# Patient Record
Sex: Male | Born: 1945 | Race: White | Hispanic: No | Marital: Married | Smoking: Former smoker
Health system: Southern US, Community
[De-identification: ages and names within clinical notes are randomized; demographics above are authoritative.]

## PROBLEM LIST (undated history)

## (undated) DIAGNOSIS — I341 Nonrheumatic mitral (valve) prolapse: Secondary | ICD-10-CM

## (undated) DIAGNOSIS — Z87442 Personal history of urinary calculi: Secondary | ICD-10-CM

## (undated) DIAGNOSIS — J449 Chronic obstructive pulmonary disease, unspecified: Secondary | ICD-10-CM

## (undated) DIAGNOSIS — M1990 Unspecified osteoarthritis, unspecified site: Secondary | ICD-10-CM

## (undated) DIAGNOSIS — C801 Malignant (primary) neoplasm, unspecified: Secondary | ICD-10-CM

## (undated) DIAGNOSIS — E785 Hyperlipidemia, unspecified: Secondary | ICD-10-CM

## (undated) DIAGNOSIS — N281 Cyst of kidney, acquired: Secondary | ICD-10-CM

## (undated) DIAGNOSIS — Z8719 Personal history of other diseases of the digestive system: Secondary | ICD-10-CM

## (undated) DIAGNOSIS — H9313 Tinnitus, bilateral: Secondary | ICD-10-CM

## (undated) DIAGNOSIS — J309 Allergic rhinitis, unspecified: Secondary | ICD-10-CM

## (undated) DIAGNOSIS — N529 Male erectile dysfunction, unspecified: Secondary | ICD-10-CM

## (undated) DIAGNOSIS — K219 Gastro-esophageal reflux disease without esophagitis: Secondary | ICD-10-CM

## (undated) DIAGNOSIS — M5030 Other cervical disc degeneration, unspecified cervical region: Secondary | ICD-10-CM

## (undated) HISTORY — PX: OTHER SURGICAL HISTORY: SHX169

## (undated) HISTORY — PX: PROSTATE SURGERY: SHX751

---

## 2012-06-27 ENCOUNTER — Emergency Department: Payer: Self-pay | Admitting: Emergency Medicine

## 2015-04-13 ENCOUNTER — Other Ambulatory Visit: Payer: Self-pay | Admitting: Family Medicine

## 2015-04-13 DIAGNOSIS — Z1231 Encounter for screening mammogram for malignant neoplasm of breast: Secondary | ICD-10-CM

## 2015-05-17 ENCOUNTER — Ambulatory Visit: Payer: Self-pay

## 2017-08-26 ENCOUNTER — Encounter: Payer: Self-pay | Admitting: *Deleted

## 2017-08-26 ENCOUNTER — Ambulatory Visit: Payer: Medicare Other | Admitting: Anesthesiology

## 2017-08-26 ENCOUNTER — Ambulatory Visit
Admission: RE | Admit: 2017-08-26 | Discharge: 2017-08-26 | Disposition: A | Discharge status: Home / Self Care | Payer: Medicare Other | Source: Ambulatory Visit | Attending: Gastroenterology | Admitting: Gastroenterology

## 2017-08-26 ENCOUNTER — Encounter
Admission: RE | Disposition: A | Discharge status: Home / Self Care | Payer: Self-pay | Source: Ambulatory Visit | Attending: Gastroenterology

## 2017-08-26 DIAGNOSIS — K2210 Ulcer of esophagus without bleeding: Secondary | ICD-10-CM | POA: Insufficient documentation

## 2017-08-26 DIAGNOSIS — Z8546 Personal history of malignant neoplasm of prostate: Secondary | ICD-10-CM | POA: Diagnosis not present

## 2017-08-26 DIAGNOSIS — K210 Gastro-esophageal reflux disease with esophagitis: Secondary | ICD-10-CM | POA: Diagnosis not present

## 2017-08-26 DIAGNOSIS — K2950 Unspecified chronic gastritis without bleeding: Secondary | ICD-10-CM | POA: Diagnosis not present

## 2017-08-26 DIAGNOSIS — Z79899 Other long term (current) drug therapy: Secondary | ICD-10-CM | POA: Diagnosis not present

## 2017-08-26 DIAGNOSIS — D124 Benign neoplasm of descending colon: Secondary | ICD-10-CM | POA: Diagnosis not present

## 2017-08-26 DIAGNOSIS — K219 Gastro-esophageal reflux disease without esophagitis: Secondary | ICD-10-CM | POA: Insufficient documentation

## 2017-08-26 DIAGNOSIS — K621 Rectal polyp: Secondary | ICD-10-CM | POA: Insufficient documentation

## 2017-08-26 DIAGNOSIS — Z7951 Long term (current) use of inhaled steroids: Secondary | ICD-10-CM | POA: Diagnosis not present

## 2017-08-26 DIAGNOSIS — Z7982 Long term (current) use of aspirin: Secondary | ICD-10-CM | POA: Diagnosis not present

## 2017-08-26 DIAGNOSIS — K635 Polyp of colon: Secondary | ICD-10-CM | POA: Diagnosis not present

## 2017-08-26 DIAGNOSIS — K449 Diaphragmatic hernia without obstruction or gangrene: Secondary | ICD-10-CM | POA: Diagnosis not present

## 2017-08-26 DIAGNOSIS — Z1211 Encounter for screening for malignant neoplasm of colon: Secondary | ICD-10-CM | POA: Diagnosis not present

## 2017-08-26 DIAGNOSIS — K648 Other hemorrhoids: Secondary | ICD-10-CM | POA: Diagnosis not present

## 2017-08-26 DIAGNOSIS — Z87891 Personal history of nicotine dependence: Secondary | ICD-10-CM | POA: Insufficient documentation

## 2017-08-26 DIAGNOSIS — K5730 Diverticulosis of large intestine without perforation or abscess without bleeding: Secondary | ICD-10-CM | POA: Insufficient documentation

## 2017-08-26 DIAGNOSIS — Z888 Allergy status to other drugs, medicaments and biological substances status: Secondary | ICD-10-CM | POA: Diagnosis not present

## 2017-08-26 HISTORY — PX: ESOPHAGOGASTRODUODENOSCOPY (EGD) WITH PROPOFOL: SHX5813

## 2017-08-26 HISTORY — DX: Gastro-esophageal reflux disease without esophagitis: K21.9

## 2017-08-26 HISTORY — DX: Male erectile dysfunction, unspecified: N52.9

## 2017-08-26 HISTORY — DX: Tinnitus, bilateral: H93.13

## 2017-08-26 HISTORY — DX: Personal history of urinary calculi: Z87.442

## 2017-08-26 HISTORY — DX: Personal history of other diseases of the digestive system: Z87.19

## 2017-08-26 HISTORY — DX: Cyst of kidney, acquired: N28.1

## 2017-08-26 HISTORY — PX: COLONOSCOPY WITH PROPOFOL: SHX5780

## 2017-08-26 HISTORY — DX: Malignant (primary) neoplasm, unspecified: C80.1

## 2017-08-26 HISTORY — DX: Allergic rhinitis, unspecified: J30.9

## 2017-08-26 SURGERY — ESOPHAGOGASTRODUODENOSCOPY (EGD) WITH PROPOFOL
Anesthesia: General

## 2017-08-26 MED ORDER — MIDAZOLAM HCL 2 MG/2ML IJ SOLN
INTRAMUSCULAR | Status: DC | PRN
  Administered 2017-08-26: 1 mg via INTRAVENOUS

## 2017-08-26 MED ORDER — PHENYLEPHRINE HCL 10 MG/ML IJ SOLN
INTRAMUSCULAR | Status: DC | PRN
  Administered 2017-08-26: 150 ug via INTRAVENOUS
  Administered 2017-08-26 (×2): 100 ug via INTRAVENOUS

## 2017-08-26 MED ORDER — FENTANYL CITRATE (PF) 100 MCG/2ML IJ SOLN
INTRAMUSCULAR | Status: DC | PRN
  Administered 2017-08-26: 50 ug via INTRAVENOUS

## 2017-08-26 MED ORDER — EPHEDRINE SULFATE 50 MG/ML IJ SOLN
INTRAMUSCULAR | Status: AC
  Filled 2017-08-26: qty 1

## 2017-08-26 MED ORDER — SODIUM CHLORIDE 0.9 % IV SOLN: INTRAVENOUS | Status: DC

## 2017-08-26 MED ORDER — LIDOCAINE HCL (CARDIAC) 20 MG/ML IV SOLN
INTRAVENOUS | Status: DC | PRN
  Administered 2017-08-26: 100 mg via INTRAVENOUS

## 2017-08-26 MED ORDER — PROPOFOL 500 MG/50ML IV EMUL
INTRAVENOUS | Status: AC
  Filled 2017-08-26: qty 50

## 2017-08-26 MED ORDER — PROPOFOL 10 MG/ML IV BOLUS
INTRAVENOUS | Status: DC | PRN
  Administered 2017-08-26: 40 mg via INTRAVENOUS

## 2017-08-26 MED ORDER — EPHEDRINE SULFATE 50 MG/ML IJ SOLN
INTRAMUSCULAR | Status: DC | PRN
  Administered 2017-08-26: 10 mg via INTRAVENOUS

## 2017-08-26 MED ORDER — MIDAZOLAM HCL 2 MG/2ML IJ SOLN
INTRAMUSCULAR | Status: AC
  Filled 2017-08-26: qty 2

## 2017-08-26 MED ORDER — LIDOCAINE HCL (PF) 2 % IJ SOLN
INTRAMUSCULAR | Status: AC
  Filled 2017-08-26: qty 10

## 2017-08-26 MED ORDER — SODIUM CHLORIDE 0.9 % IV SOLN
INTRAVENOUS | Status: DC
  Administered 2017-08-26 (×2): via INTRAVENOUS

## 2017-08-26 MED ORDER — PROPOFOL 500 MG/50ML IV EMUL
INTRAVENOUS | Status: DC | PRN
  Administered 2017-08-26: 180 ug/kg/min via INTRAVENOUS

## 2017-08-26 MED ORDER — FENTANYL CITRATE (PF) 100 MCG/2ML IJ SOLN
INTRAMUSCULAR | Status: AC
  Filled 2017-08-26: qty 2

## 2017-08-26 NOTE — Anesthesia Postprocedure Evaluation (Signed)
Anesthesia Post Note  Patient: Tristan Anderson  Procedure(s) Performed: ESOPHAGOGASTRODUODENOSCOPY (EGD) WITH PROPOFOL (N/A ) COLONOSCOPY WITH PROPOFOL (N/A )  Patient location during evaluation: Endoscopy Anesthesia Type: General Level of consciousness: awake and alert Pain management: pain level controlled Vital Signs Assessment: post-procedure vital signs reviewed and stable Respiratory status: spontaneous breathing and respiratory function stable Cardiovascular status: stable Anesthetic complications: no     Last Vitals:  Vitals:   08/26/17 1033 08/26/17 1103  BP: 127/79 131/87  Pulse: 89   Resp:    Temp: (!) 35.7 C   SpO2: 98%     Last Pain:  Vitals:   08/26/17 1033  TempSrc: Tympanic                 Blia Totman K

## 2017-08-26 NOTE — Op Note (Signed)
[facility] Gastroenterology Patient Name: Tristan Anderson Procedure Date: 08/26/2017 9:13 AM MRN: [id number] Account #: [id number] Date of Birth: 12-Dec-1945 Admit Type: Outpatient Age: 72 Room: [facility] ENDO ROOM 1 Gender: Male Note Status: Finalized Procedure:            Colonoscopy Indications:          Screening for colorectal malignant neoplasm Providers:            Lollie Sails, MD Referring MD:         Wynona Canes. Kym Groom, MD (Referring MD) Medicines:            Monitored Anesthesia Care Complications:        No immediate complications. Procedure:            Pre-Anesthesia Assessment:                       - ASA Grade Assessment: II - A patient with mild                        systemic disease.                       After obtaining informed consent, the colonoscope was                        passed under direct vision. Throughout the procedure,                        the patient's blood pressure, pulse, and oxygen                        saturations were monitored continuously. The                        Colonoscope was introduced through the anus and                        advanced to the the cecum, identified by appendiceal                        orifice and ileocecal valve. The colonoscopy was                        performed with moderate difficulty due to poor bowel                        prep, significant looping and a tortuous colon.                        Successful completion of the procedure was aided by                        changing the patient to a supine position, changing the                        patient to a prone position, using manual pressure and                        lavage. The quality of the bowel preparation was fair. Findings:      A 5 mm  polyp was found in the descending colon. The polyp was sessile.       The polyp was removed with a cold snare. Resection and retrieval were       complete.      A 2 mm polyp was found in the  proximal transverse colon. The polyp was       sessile. The polyp was removed with a cold biopsy forceps. Resection and       retrieval were complete.      Two sessile polyps were found in the rectum. The polyps were 1 to 2 mm       in size. These polyps were removed with a cold biopsy forceps. Resection       and retrieval were complete.      A few small-mouthed diverticula were found in the sigmoid colon.      Non-bleeding internal hemorrhoids were found during retroflexion and       during anoscopy. The hemorrhoids were Grade I (internal hemorrhoids that       do not prolapse).      The digital rectal exam was normal. Impression:           - Preparation of the colon was fair.                       - One 5 mm polyp in the descending colon, removed with                        a cold snare. Resected and retrieved.                       - One 2 mm polyp in the proximal transverse colon,                        removed with a cold biopsy forceps. Resected and                        retrieved.                       - Two 1 to 2 mm polyps in the rectum, removed with a                        cold biopsy forceps. Resected and retrieved.                       - Diverticulosis in the sigmoid colon.                       - Non-bleeding internal hemorrhoids. Recommendation:       - Discharge patient to home.                       - Telephone GI clinic for pathology results in 1 week. Procedure Code(s):    --- Professional ---                       [phone], Colonoscopy, flexible; with removal of tumor(s),                        polyp(s), or other lesion(s) by snare technique  [id number], 59, Colonoscopy, flexible; with biopsy, single                        or multiple CPT copyright 2016 American Medical Association. All rights reserved. The codes documented in this report are preliminary and upon coder review may  be revised to meet current compliance requirements. Lollie Sails,  MD 08/26/2017 10:23:10 AM This report has been signed electronically. Number of Addenda: 0 Note Initiated On: 08/26/2017 9:13 AM Scope Withdrawal Time: 0 hours 11 minutes 34 seconds  Total Procedure Duration: 0 hours 42 minutes 57 seconds       [facility]

## 2017-08-26 NOTE — Anesthesia Post-op Follow-up Note (Signed)
Anesthesia QCDR form completed.        

## 2017-08-26 NOTE — Anesthesia Procedure Notes (Signed)
Date/Time: 08/26/2017 9:45 AM Performed by: Allean Found, CRNA Pre-anesthesia Checklist: Patient identified, Emergency Drugs available, Suction available, Patient being monitored and Timeout performed Patient Re-evaluated:Patient Re-evaluated prior to induction Oxygen Delivery Method: Nasal cannula Placement Confirmation: positive ETCO2

## 2017-08-26 NOTE — Anesthesia Preprocedure Evaluation (Signed)
Anesthesia Evaluation  Patient identified by MRN, date of birth, ID band Patient awake    Reviewed: Allergy & Precautions, NPO status , Patient's Chart, lab work & pertinent test results  History of Anesthesia Complications Negative for: history of anesthetic complications  Airway Mallampati: I       Dental   Pulmonary neg sleep apnea, neg COPD, former smoker,           Cardiovascular (-) hypertension(-) Past MI and (-) CHF (-) dysrhythmias + Valvular Problems/Murmurs MVP      Neuro/Psych neg Seizures    GI/Hepatic Neg liver ROS, hiatal hernia, GERD  Medicated,  Endo/Other  neg diabetes  Renal/GU negative Renal ROS     Musculoskeletal   Abdominal   Peds  Hematology   Anesthesia Other Findings   Reproductive/Obstetrics                             Anesthesia Physical Anesthesia Plan  ASA: II  Anesthesia Plan: General   Post-op Pain Management:    Induction: Intravenous  PONV Risk Score and Plan: 2 and Propofol infusion and TIVA  Airway Management Planned: Nasal Cannula  Additional Equipment:   Intra-op Plan:   Post-operative Plan:   Informed Consent: I have reviewed the patients History and Physical, chart, labs and discussed the procedure including the risks, benefits and alternatives for the proposed anesthesia with the patient or authorized representative who has indicated his/her understanding and acceptance.     Plan Discussed with:   Anesthesia Plan Comments:         Anesthesia Quick Evaluation

## 2017-08-26 NOTE — Transfer of Care (Signed)
Immediate Anesthesia Transfer of Care Note  Patient: Tristan Anderson  Procedure(s) Performed: ESOPHAGOGASTRODUODENOSCOPY (EGD) WITH PROPOFOL (N/A ) COLONOSCOPY WITH PROPOFOL (N/A )  Patient Location: PACU  Anesthesia Type:General  Level of Consciousness: awake  Airway & Oxygen Therapy: Patient Spontanous Breathing and Patient connected to nasal cannula oxygen  Post-op Assessment: Report given to RN and Post -op Vital signs reviewed and stable  Post vital signs: Reviewed and stable  Last Vitals:  Vitals:   08/26/17 0855 08/26/17 1033  BP: 139/83   Pulse: 75   Resp: 18   Temp: (!) 36.3 C (!) (P) 35.7 C  SpO2: 97%     Last Pain:  Vitals:   08/26/17 1033  TempSrc: (P) Tympanic         Complications: No apparent anesthesia complications

## 2017-08-26 NOTE — Op Note (Signed)
[facility] Gastroenterology Patient Name: Tristan Anderson Procedure Date: 08/26/2017 9:13 AM MRN: [id number] Account #: [id number] Date of Birth: [birth year] Admit Type: Outpatient Age: 72 Room: [facility] ENDO ROOM 1 Gender: Male Note Status: Finalized Procedure:            Upper GI endoscopy Indications:          Gastro-esophageal reflux disease Providers:            Lollie Sails, MD Referring MD:         Wynona Canes. Kym Groom, MD (Referring MD) Medicines:            Monitored Anesthesia Care Complications:        No immediate complications. Procedure:            Pre-Anesthesia Assessment:                       - ASA Grade Assessment: II - A patient with mild                        systemic disease.                       After obtaining informed consent, the endoscope was                        passed under direct vision. Throughout the procedure,                        the patient's blood pressure, pulse, and oxygen                        saturations were monitored continuously. The Endoscope                        was introduced through the mouth, and advanced to the                        third part of duodenum. The patient tolerated the                        procedure well. Findings:      LA Grade B (one or more mucosal breaks greater than 5 mm, not extending       between the tops of two mucosal folds) esophagitis with no bleeding was       found 49 cm from the incisors.      There were esophageal mucosal changes suggestive of short-segment       Barrett's esophagus present in the lower third of the esophagus. The       maximum longitudinal extent of these mucosal changes was 1 cm in length.       Mucosa was biopsied with a cold forceps for histology.      The exam of the esophagus was otherwise normal.      A small hiatal hernia was found. The Z-line was a variable distance from       incisors; the hiatal hernia was sliding.      Patchy mild inflammation  characterized by congestion (edema), erosions       and erythema was found in the gastric body, on the greater curvature of       the gastric body and  in the gastric antrum. Biopsies were taken with a       cold forceps for histology. Biopsies were taken with a cold forceps for       Helicobacter pylori testing.      The cardia and gastric fundus were normal on retroflexion otherwise.      The examined duodenum was normal. Impression:           - LA Grade B erosive esophagitis.                       - Esophageal mucosal changes suggestive of                        short-segment Barrett's esophagus. Biopsied.                       - Small hiatal hernia.                       - Erosive gastritis. Biopsied.                       - Normal examined duodenum. Recommendation:       - Await pathology results.                       - Use Protonix (pantoprazole) 40 mg PO daily daily. Procedure Code(s):    --- Professional ---                       [phone], Esophagogastroduodenoscopy, flexible, transoral;                        with biopsy, single or multiple Diagnosis Code(s):    --- Professional ---                       K20.8, Other esophagitis                       K22.8, Other specified diseases of esophagus                       K44.9, Diaphragmatic hernia without obstruction or                        gangrene                       K29.60, Other gastritis without bleeding                       K21.9, Gastro-esophageal reflux disease without                        esophagitis CPT copyright 2016 American Medical Association. All rights reserved. The codes documented in this report are preliminary and upon coder review may  be revised to meet current compliance requirements. Lollie Sails, MD 08/26/2017 9:33:57 AM This report has been signed electronically. Number of Addenda: 0 Note Initiated On: 08/26/2017 9:13 AM      [facility]

## 2017-08-26 NOTE — H&P (Signed)
Outpatient short stay form Pre-procedure 08/26/2017 8:55 AM Lollie Sails MD  Primary Physician: Dr. Johny Drilling  Reason for visit:  EGD and colonoscopy  History of present illness:  Patient is a 72 year old male presenting today as above. He has personal history of reflux related esophagitis seen on EGD in 2008. He has been taking NSAIDs regularly. His last colonoscopy was also in 2008.  Patient tolerated his prep well. He does take 81 mg aspirin that he is held for a couple days. He takes no other aspirin products or blood thinning agents.    Current Facility-Administered Medications:  .  0.9 %  sodium chloride infusion, , Intravenous, Continuous, Lollie Sails, MD .  0.9 %  sodium chloride infusion, , Intravenous, Continuous, Lollie Sails, MD  Medications Prior to Admission  Medication Sig Dispense Refill Last Dose  . albuterol (PROVENTIL HFA;VENTOLIN HFA) 108 (90 Base) MCG/ACT inhaler Inhale into the lungs every 6 (six) hours as needed for wheezing or shortness of breath.   Past Month at Unknown time  . aspirin EC 81 MG tablet Take 81 mg by mouth daily.   Past Week at Unknown time  . clotrimazole-betamethasone (LOTRISONE) cream Apply 1 application topically 2 (two) times daily.   08/26/2017 at 0730  . fluticasone (FLONASE) 50 MCG/ACT nasal spray Place into both nostrils 2 (two) times daily as needed for allergies or rhinitis.   Past Week at Unknown time  . Multiple Vitamin (MULTIVITAMIN) tablet Take 1 tablet by mouth daily.   Past Week at Unknown time  . omeprazole (PRILOSEC) 20 MG capsule Take 20 mg by mouth daily.   08/25/2017 at Unknown time  . pravastatin (PRAVACHOL) 40 MG tablet Take 40 mg by mouth daily.   Past Week at Unknown time  . fexofenadine (ALLEGRA) 30 MG tablet Take 30 mg by mouth 2 (two) times daily as needed.   Not Taking at Unknown time  . sildenafil (REVATIO) 20 MG tablet Take 20 mg by mouth 3 (three) times daily.   Not Taking at Unknown time      Allergies  Allergen Reactions  . Darvon [Propoxyphene]      Past Medical History:  Diagnosis Date  . Allergic rhinitis   . Cancer [facility])    prostate  . Cyst of right kidney   . ED (erectile dysfunction)   . GERD (gastroesophageal reflux disease)   . History of hiatal hernia   . History of kidney stones   . Tinnitus of both ears     Review of systems:      Physical Exam    Heart and lungs: Regular rate and rhythm without rub or gallop, lungs are bilaterally clear.    HEENT: Normocephalic atraumatic eyes are anicteric    Other:     Pertinant exam for procedure: Soft nontender nondistended bowel sounds positive normoactive.    Planned proceedures: EGD and Colonoscopy with indicated procedures. I have discussed the risks benefits and complications of procedures to include not limited to bleeding, infection, perforation and the risk of sedation and the patient wishes to proceed.    Lollie Sails, MD Gastroenterology 08/26/2017  8:55 AM

## 2017-08-27 ENCOUNTER — Encounter: Payer: Self-pay | Admitting: Gastroenterology

## 2017-08-29 LAB — SURGICAL PATHOLOGY

## 2020-04-11 ENCOUNTER — Other Ambulatory Visit: Payer: Self-pay | Admitting: Specialist

## 2020-04-11 DIAGNOSIS — R0609 Other forms of dyspnea: Secondary | ICD-10-CM

## 2020-04-11 DIAGNOSIS — R059 Cough, unspecified: Secondary | ICD-10-CM

## 2020-04-21 ENCOUNTER — Ambulatory Visit
Admission: RE | Admit: 2020-04-21 | Discharge: 2020-04-21 | Disposition: A | Discharge status: Home / Self Care | Payer: Medicare Other | Source: Ambulatory Visit | Attending: Specialist | Admitting: Specialist

## 2020-04-21 ENCOUNTER — Other Ambulatory Visit: Payer: Self-pay

## 2020-04-21 DIAGNOSIS — R059 Cough, unspecified: Secondary | ICD-10-CM

## 2020-04-21 DIAGNOSIS — R05 Cough: Secondary | ICD-10-CM | POA: Insufficient documentation

## 2020-04-21 DIAGNOSIS — R0600 Dyspnea, unspecified: Secondary | ICD-10-CM | POA: Diagnosis present

## 2020-04-21 DIAGNOSIS — R0609 Other forms of dyspnea: Secondary | ICD-10-CM

## 2021-04-21 ENCOUNTER — Other Ambulatory Visit: Payer: Self-pay

## 2021-04-21 ENCOUNTER — Encounter: Payer: Self-pay | Admitting: Emergency Medicine

## 2021-04-21 ENCOUNTER — Ambulatory Visit
Admission: EM | Admit: 2021-04-21 | Discharge: 2021-04-21 | Disposition: A | Discharge status: Home / Self Care | Payer: Medicare Other | Attending: Emergency Medicine | Admitting: Emergency Medicine

## 2021-04-21 DIAGNOSIS — J069 Acute upper respiratory infection, unspecified: Secondary | ICD-10-CM | POA: Insufficient documentation

## 2021-04-21 DIAGNOSIS — U071 COVID-19: Secondary | ICD-10-CM | POA: Diagnosis not present

## 2021-04-21 MED ORDER — IPRATROPIUM BROMIDE 0.06 % NA SOLN: 2.0000 | Freq: Four times a day (QID) | NASAL | 12 refills | Status: AC

## 2021-04-21 MED ORDER — BENZONATATE 100 MG PO CAPS: 200.0000 mg | ORAL_CAPSULE | Freq: Three times a day (TID) | ORAL | 0 refills | Status: AC

## 2021-04-21 NOTE — Discharge Instructions (Addendum)
Use the Atrovent nasal spray, 2 squirts in each nostril every 6 hours, as needed for runny nose and postnasal drip.  Use the Tessalon Perles every 8 hours during the day.  Take them with a small sip of water.  They may give you some numbness to the base of your tongue or a metallic taste in your mouth, this is normal.  If your COVID test is positive we will prescribe Paxlovid twice daily for 5 days.   Return for reevaluation or see your primary care provider for any new or worsening symptoms.

## 2021-04-21 NOTE — ED Provider Notes (Addendum)
[facility]    CSN: [id number] Arrival date & time: 04/21/21  1141      History   Chief Complaint Chief Complaint  Patient presents with   Cough   Fatigue   Generalized Body Aches    HPI Tristan Anderson is a 75 y.o. male.   HPI  75 year old male here for evaluation of respiratory complaints.  Patient reports that he has been experiencing nasal congestion with a runny nose for clear nasal discharge, cough that is both productive and nonproductive, body aches, fatigue, and fever that started yesterday.  Patient reports a T-max of 100.  He is currently on prednisone and Trelegy for interstitial lung disease.  He reports that his cough is only intermittently productive and it usually first thing in the morning.  He states he coughs also mostly in the morning but does not have an issue at nighttime.  He has not had associated symptoms of ear pain or pressure, sore throat, shortness of breath or wheezing, GI complaints, or known sick contacts.  Patient not in any acute distress, he can speak in full sentences, he has no dyspnea or tachypnea.  Past Medical History:  Diagnosis Date   Allergic rhinitis    Cancer ([place])    prostate   Cyst of right kidney    ED (erectile dysfunction)    GERD (gastroesophageal reflux disease)    History of hiatal hernia    History of kidney stones    Tinnitus of both ears     There are no problems to display for this patient.   Past Surgical History:  Procedure Laterality Date   COLONOSCOPY WITH PROPOFOL N/A 08/26/2017   Procedure: COLONOSCOPY WITH PROPOFOL;  Surgeon: Lollie Sails, MD;  Location: [facility] ENDOSCOPY;  Service: Endoscopy;  Laterality: N/A;   ESOPHAGOGASTRODUODENOSCOPY (EGD) WITH PROPOFOL N/A 08/26/2017   Procedure: ESOPHAGOGASTRODUODENOSCOPY (EGD) WITH PROPOFOL;  Surgeon: Lollie Sails, MD;  Location: [facility] ENDOSCOPY;  Service: Endoscopy;  Laterality: N/A;   muscle transfer uppper extremity     PROSTATE SURGERY          Home Medications    Prior to Admission medications   Medication Sig Start Date End Date Taking? Authorizing Provider  aspirin EC 81 MG tablet Take 81 mg by mouth daily.   Yes [provider]  benzonatate (TESSALON) 100 MG capsule Take 2 capsules (200 mg total) by mouth every 8 (eight) hours. 04/21/21  Yes Margarette Canada, NP  fexofenadine (ALLEGRA) 30 MG tablet Take 30 mg by mouth 2 (two) times daily as needed.   Yes [provider]  fluticasone (FLONASE) 50 MCG/ACT nasal spray Place into both nostrils 2 (two) times daily as needed for allergies or rhinitis.   Yes [provider]  gabapentin (NEURONTIN) 100 MG capsule Take by mouth. 04/04/21  Yes [provider]  ipratropium (ATROVENT) 0.06 % nasal spray Place 2 sprays into both nostrils 4 (four) times daily. 04/21/21  Yes Margarette Canada, NP  Multiple Vitamin (MULTIVITAMIN) tablet Take 1 tablet by mouth daily.   Yes [provider]  pantoprazole (PROTONIX) 40 MG tablet Take 40 mg by mouth daily. 03/27/21  Yes [provider]  pravastatin (PRAVACHOL) 40 MG tablet Take 40 mg by mouth daily.   Yes [provider]  predniSONE (DELTASONE) 10 MG tablet Take by mouth. 01/24/21  Yes [provider]  TRELEGY ELLIPTA 100-62.5-25 MCG/INH AEPB Inhale 1 puff into the lungs daily. 04/04/21  Yes [provider]  albuterol (  PROVENTIL HFA;VENTOLIN HFA) 108 (90 Base) MCG/ACT inhaler Inhale into the lungs every 6 (six) hours as needed for wheezing or shortness of breath.    [provider]  clotrimazole-betamethasone (LOTRISONE) cream Apply 1 application topically 2 (two) times daily.    [provider]  sildenafil (REVATIO) 20 MG tablet Take 20 mg by mouth 3 (three) times daily.    [provider]    Family History History reviewed. No pertinent family history.  Social History Social History   Tobacco Use   Smoking status: Former   Smokeless tobacco:  Never  Scientific laboratory technician Use: Never used  Substance Use Topics   Alcohol use: Yes    Alcohol/week: 7.0 - 14.0 standard drinks    Types: 1 - 2 Glasses of wine, 6 - 12 Cans of beer per week     Allergies   Darvon [propoxyphene]   Review of Systems Review of Systems  Constitutional:  Positive for fatigue and fever. Negative for activity change and appetite change.  HENT:  Positive for congestion and rhinorrhea. Negative for ear pain and sore throat.   Respiratory:  Positive for cough. Negative for shortness of breath and wheezing.   Gastrointestinal:  Negative for diarrhea, nausea and vomiting.  Skin:  Negative for rash.  Hematological: Negative.   Psychiatric/Behavioral: Negative.      Physical Exam Triage Vital Signs ED Triage Vitals  Enc Vitals Group     BP 04/21/21 1235 (!) 145/83     Pulse Rate 04/21/21 1235 79     Resp 04/21/21 1235 16     Temp 04/21/21 1235 98.5 F (36.9 C)     Temp Source 04/21/21 1235 Oral     SpO2 04/21/21 1235 95 %     Weight 04/21/21 1230 180 lb (81.6 kg)     Height 04/21/21 1230 5' 7.5" (1.715 m)     Head Circumference --      Peak Flow --      Pain Score 04/21/21 1229 2     Pain Loc --      Pain Edu? --      Excl. in [place]? --    No data found.  Updated Vital Signs BP (!) 145/83 (BP Location: Left Arm)   Pulse 79   Temp 98.5 F (36.9 C) (Oral)   Resp 16   Ht 5' 7.5" (1.715 m)   Wt 180 lb (81.6 kg)   SpO2 95%   BMI 27.78 kg/m   Visual Acuity Right Eye Distance:   Left Eye Distance:   Bilateral Distance:    Right Eye Near:   Left Eye Near:    Bilateral Near:     Physical Exam Vitals and nursing note reviewed.  Constitutional:      General: He is not in acute distress.    Appearance: Normal appearance. He is normal weight. He is not ill-appearing.  HENT:     Head: Normocephalic and atraumatic.     Right Ear: Tympanic membrane, ear canal and external ear normal. There is no impacted cerumen.     Left Ear: Tympanic  membrane, ear canal and external ear normal. There is no impacted cerumen.     Nose: Congestion and rhinorrhea present.     Mouth/Throat:     Mouth: Mucous membranes are moist.     Pharynx: Oropharynx is clear. Posterior oropharyngeal erythema present.  Cardiovascular:     Rate and Rhythm: Normal rate and regular rhythm.  Pulses: Normal pulses.     Heart sounds: Normal heart sounds. No murmur heard.   No gallop.  Pulmonary:     Effort: Pulmonary effort is normal.     Breath sounds: Normal breath sounds. No wheezing, rhonchi or rales.  Musculoskeletal:     Cervical back: Normal range of motion and neck supple.  Lymphadenopathy:     Cervical: No cervical adenopathy.  Skin:    General: Skin is warm and dry.     Capillary Refill: Capillary refill takes less than 2 seconds.     Findings: No erythema or rash.  Neurological:     General: No focal deficit present.     Mental Status: He is alert and oriented to person, place, and time.  Psychiatric:        Mood and Affect: Mood normal.        Behavior: Behavior normal.        Thought Content: Thought content normal.        Judgment: Judgment normal.     UC Treatments / Results  Labs (all labs ordered are listed, but only abnormal results are displayed) Labs Reviewed  SARS CORONAVIRUS 2 (TAT 6-24 HRS)    EKG   Radiology No results found.  Procedures Procedures (including critical care time)  Medications Ordered in UC Medications - No data to display  Initial Impression / Assessment and Plan / UC Course  I have reviewed the triage vital signs and the nursing notes.  Pertinent labs & imaging results that were available during my care of the patient were reviewed by me and considered in my medical decision making (see chart for details).  Patient is a very pleasant nontoxic-appearing 75 year old male here for evaluation of respiratory complaints as outlined in the HPI above.  Patient's physical exam reveals pearly gray  tympanic membranes bilaterally with normal light reflex and clear external auditory canals.  Nasal mucosa is erythematous and mildly edematous with scant clear nasal discharge.  Oropharyngeal exam is benign.  No cervical lymphadenopathy appreciated on exam.  Cardiopulmonary exam reveals clear lung sounds in all fields.  Patient exam is consistent with a viral URI.  Due to patient's immunosuppression secondary to his interstitial lung disease will swab patient for COVID.  He has been vaccinated and boosted.  We will also give patient Atrovent nasal spray to help with nasal congestion and Tessalon Perles for cough.  Return and ER precautions reviewed with patient.  If patient is positive for COVID we will prescribe Paxlovid.  Patient's GFR from January 13, 2021 is 58.   Final Clinical Impressions(s) / UC Diagnoses   Final diagnoses:  Viral URI with cough     Discharge Instructions      Use the Atrovent nasal spray, 2 squirts in each nostril every 6 hours, as needed for runny nose and postnasal drip.  Use the Tessalon Perles every 8 hours during the day.  Take them with a small sip of water.  They may give you some numbness to the base of your tongue or a metallic taste in your mouth, this is normal.  If your COVID test is positive we will prescribe Paxlovid twice daily for 5 days.   Return for reevaluation or see your primary care provider for any new or worsening symptoms.      ED Prescriptions     Medication Sig Dispense Auth. Provider   benzonatate (TESSALON) 100 MG capsule Take 2 capsules (200 mg total) by mouth every 8 (eight) hours.  21 capsule Margarette Canada, NP   ipratropium (ATROVENT) 0.06 % nasal spray Place 2 sprays into both nostrils 4 (four) times daily. 15 mL Margarette Canada, NP      PDMP not reviewed this encounter.   Margarette Canada, NP 04/21/21 1334    Margarette Canada, NP 04/21/21 1335

## 2021-04-21 NOTE — ED Triage Notes (Signed)
Patient c/o cough, congestion, runny nose, bodyaches and fatigue that started yesterday.  Patient also reports low grade fever.

## 2021-04-22 ENCOUNTER — Telehealth: Payer: Self-pay | Admitting: Emergency Medicine

## 2021-04-22 LAB — SARS CORONAVIRUS 2 (TAT 6-24 HRS): SARS Coronavirus 2: POSITIVE — AB

## 2021-04-22 MED ORDER — NIRMATRELVIR/RITONAVIR (PAXLOVID)TABLET: 3.0000 | ORAL_TABLET | Freq: Two times a day (BID) | ORAL | 0 refills | Status: AC

## 2021-04-22 NOTE — Telephone Encounter (Signed)
I spoke with the patient via telephone this morning and verified his identity by name and date of birth.  Patient notified that his COVID test was positive and I informed him that I sent a prescription for Paxlovid to the pharmacy and he is to take it twice daily for 5 days.  The sildenafil that he takes will interact with the Paxlovid so advised him to hold it while he was on the Paxlovid.  He is also concerned about the thick mucus in his throat and I advised him that he can take plain over-the-counter Mucinex and increase his water intake to help thin his mucus out and make it easier to clear and manage.  I reviewed the ER precautions to include any development of shortness of breath, especially at rest, and inability to speak in full sentences, or late side of lips turning blue that he is to go to the ER for evaluation.  Patient verbalized understanding of instructions.

## 2021-05-04 ENCOUNTER — Other Ambulatory Visit: Payer: Self-pay | Admitting: Specialist

## 2021-05-04 ENCOUNTER — Other Ambulatory Visit (HOSPITAL_BASED_OUTPATIENT_CLINIC_OR_DEPARTMENT_OTHER): Payer: Self-pay | Admitting: Specialist

## 2021-05-04 DIAGNOSIS — J849 Interstitial pulmonary disease, unspecified: Secondary | ICD-10-CM

## 2021-05-04 DIAGNOSIS — J439 Emphysema, unspecified: Secondary | ICD-10-CM

## 2021-05-24 ENCOUNTER — Ambulatory Visit
Admission: RE | Admit: 2021-05-24 | Discharge: 2021-05-24 | Disposition: A | Discharge status: Home / Self Care | Payer: Medicare Other | Source: Ambulatory Visit | Attending: Specialist | Admitting: Specialist

## 2021-05-24 DIAGNOSIS — J439 Emphysema, unspecified: Secondary | ICD-10-CM | POA: Insufficient documentation

## 2021-05-24 DIAGNOSIS — J849 Interstitial pulmonary disease, unspecified: Secondary | ICD-10-CM | POA: Diagnosis not present

## 2021-08-17 IMAGING — CT CT CHEST W/O CM
1 series · 14 of 34 positions shown, 18 images · non-contrast
Comparison: None.

CLINICAL DATA: Dry cough for 2 months, shortness of breath, former
smoker

EXAM:
CT CHEST WITHOUT CONTRAST
TECHNIQUE: Multidetector CT imaging of the chest was performed following the
standard protocol without IV contrast.

[Series 2: thorax · axial · 0.78mm/px · z∈[-575,-313]mm · 14 of 155 slices shown, 18 images]
[im 12/155  mediastinal]
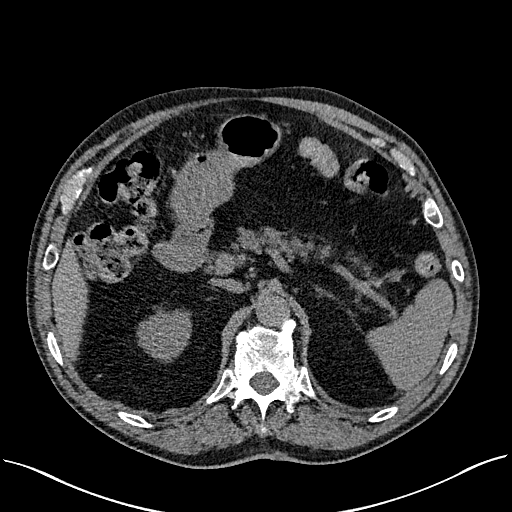
[im 12/155  lung]
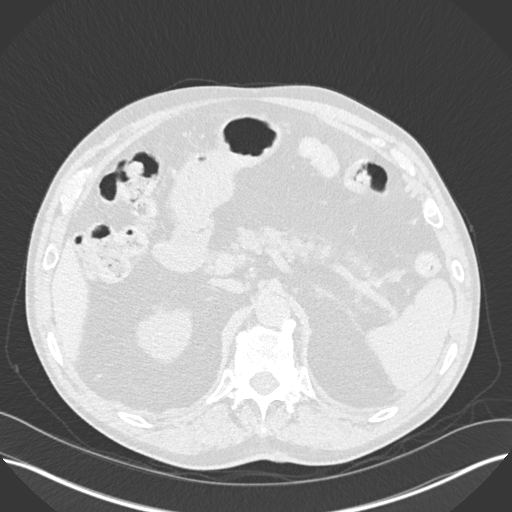
[im 23/155  lung]
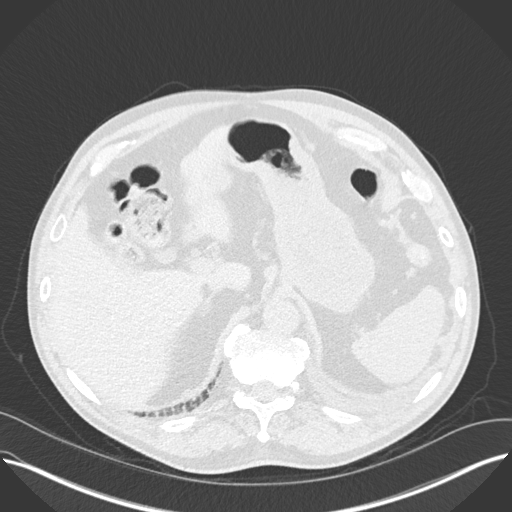
[im 31/155  lung]
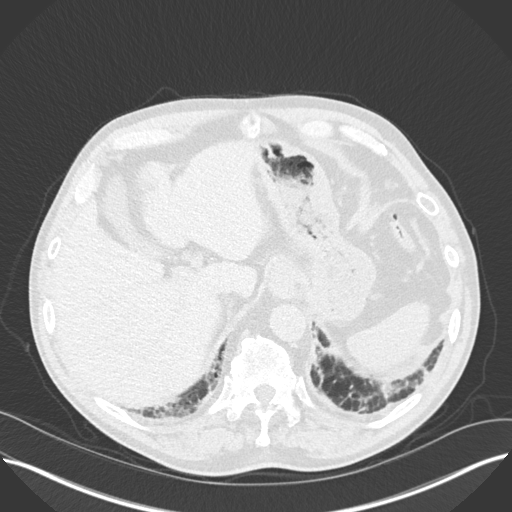
[im 46/155  lung]
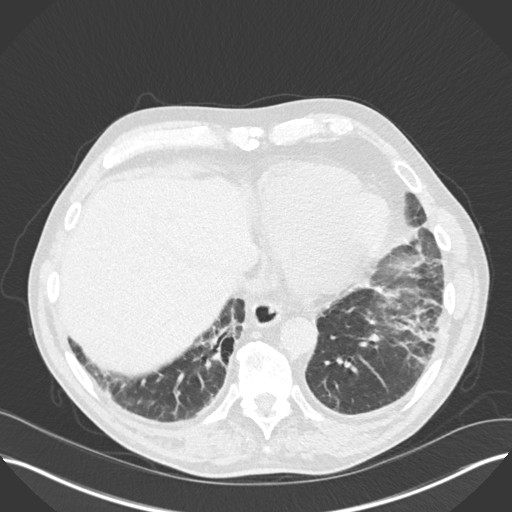
[im 58/155  mediastinal]
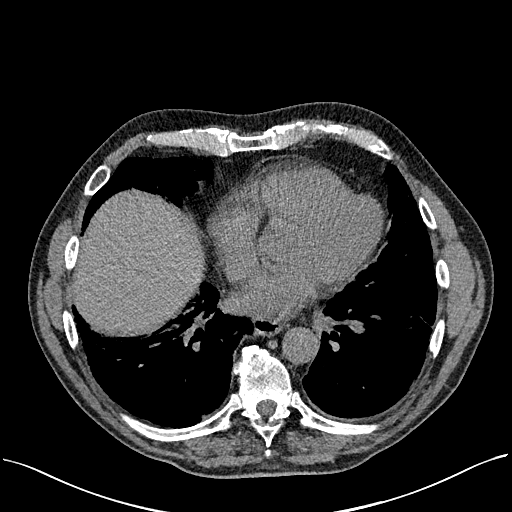
[im 58/155  lung]
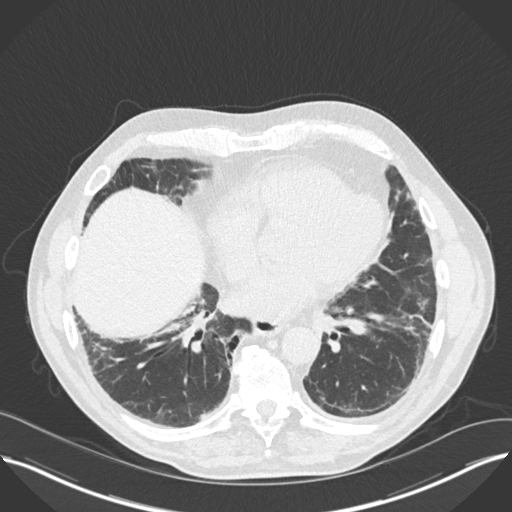
[im 63/155  lung]
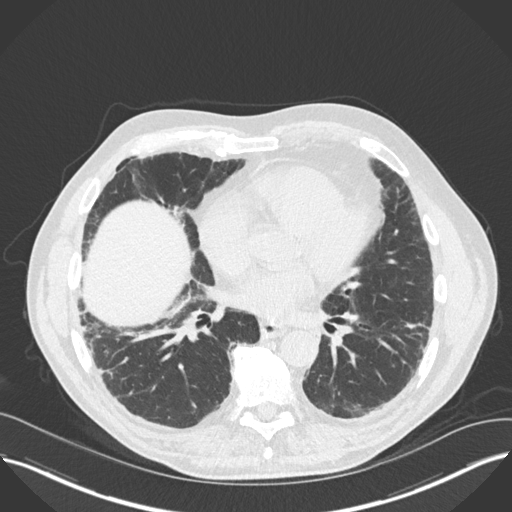
[im 74/155  lung]
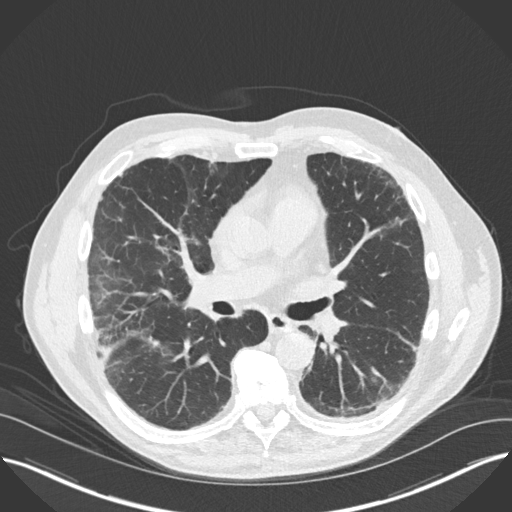
[im 83/155  lung]
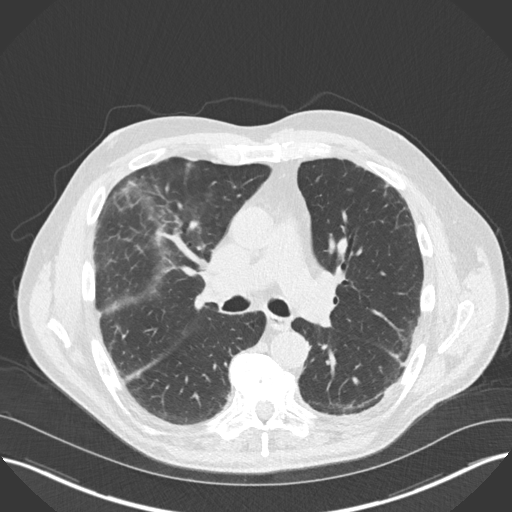
[im 92/155  mediastinal]
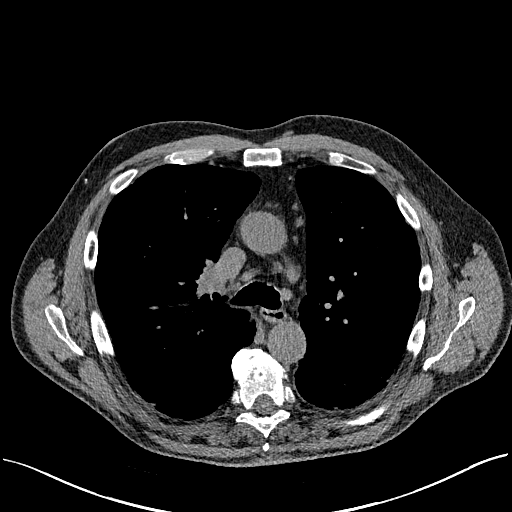
[im 92/155  lung]
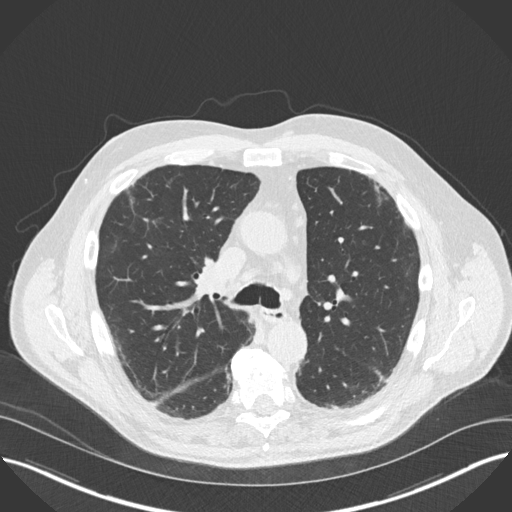
[im 97/155  lung]
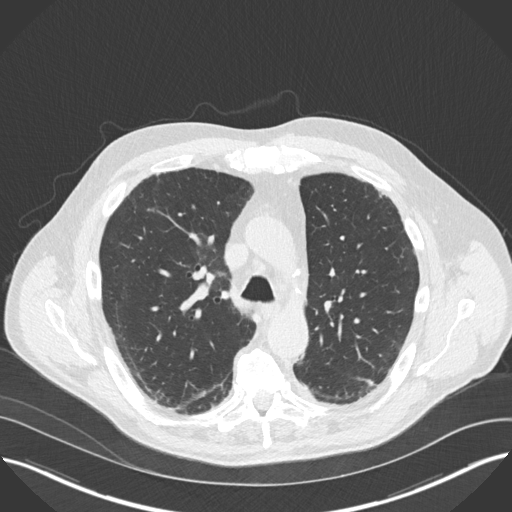
[im 115/155  lung]
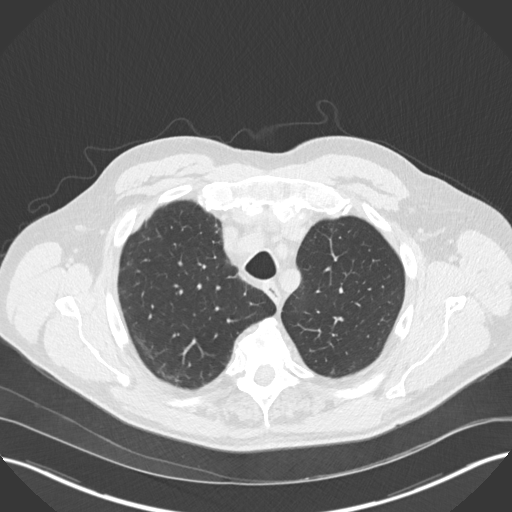
[im 124/155  lung]
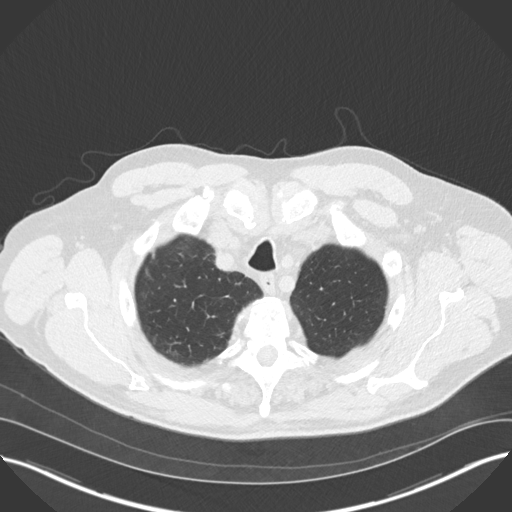
[im 132/155  mediastinal]
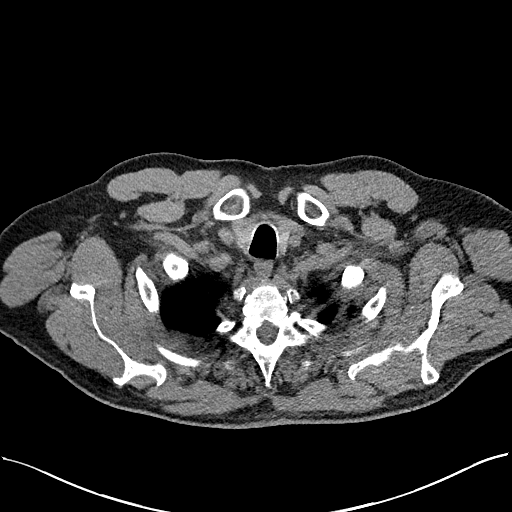
[im 132/155  lung]
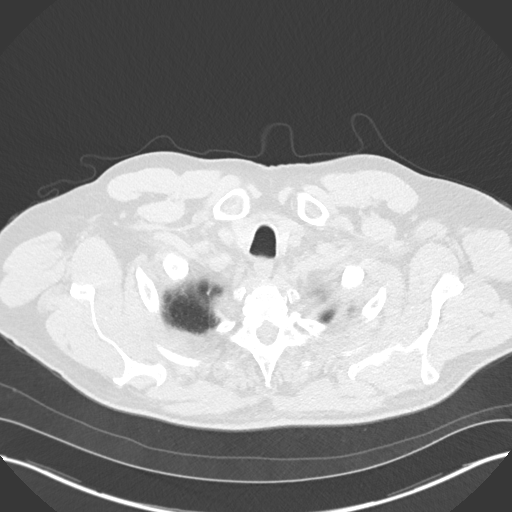
[im 143/155  lung]
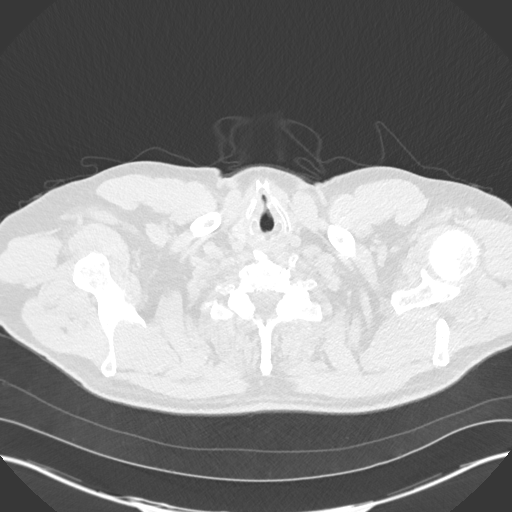

[14 of 34 positions shown; findings below may reference images not displayed]

FINDINGS: Cardiovascular: No significant vascular findings. Normal heart size.
No pericardial effusion.

Mediastinum/Nodes: No enlarged mediastinal, hilar, or axillary lymph
nodes. Thyroid gland, trachea, and esophagus demonstrate no
significant findings.

Lungs/Pleura: There is predominantly dependent bibasilar scarring
and or fibrosis in a pattern featuring extensive irregular
interstitial opacity, predominantly tubular bronchiectasis, and
architectural distortion and volume loss, particularly the right
lower lobe. There is no evidence of significant bronchiolectasis or
honeycombing. There is some evidence of subpleural sparing
particularly at the right lung base (series 3, image 97) and in the
dependent right upper lobe (series 3, image 57), as well as
involvement of non dependent portions of the right middle lobe and
lingula (series 3, image 84). There are occasional small pulmonary
nodules, for example a 4 mm nodule in the lingula (series 3, image
91). No pleural effusion or pneumothorax.

Upper Abdomen: No acute abnormality.

Musculoskeletal: No chest wall mass or suspicious bone lesions
identified.
IMPRESSION: 1. There is predominantly dependent bibasilar scarring and/or
fibrosis in a pattern featuring extensive irregular interstitial
opacity, predominantly tubular bronchiectasis, and architectural
distortion and volume loss, particularly the right lower lobe. There
is no evidence of significant bronchiolectasis or honeycombing.
There is some evidence of subpleural sparing particularly at the
right lung base and in the dependent right upper lobe, as well as
involvement of non dependent portions of the lungs. These findings
are generally nonspecific and may reflect fibrotic interstitial lung
disease or alternately bland sequelae of prior infection or
inflammation. If characterized by ATS pulmonary fibrosis, these
findings are in an "indeterminate for UIP" pattern. Comparison to
prior imaging, if available, would be helpful to assess for change
over time. Consider follow-up ILD protocol CT in 1 year to assess
for stability of fibrotic findings and pattern. Findings are
indeterminate for UIP per consensus guidelines: Diagnosis of
Idiopathic Pulmonary Fibrosis: An Official ATS/ERS/JRS/ALAT Clinical
Practice Guideline. Am J Respir Crit Care Med Vol 198, Perret 5,
ppe99-e[DATE].
2. There are occasional small pulmonary nodules, for example a 4 mm
nodule in the lingula. No follow-up needed if patient is low-risk
(and has no known or suspected primary neoplasm). Non-contrast chest
CT can be considered in 12 months if patient is high-risk (and can
be performed in conjunction with above ILD protocol CT). This
recommendation follows the consensus statement: Guidelines for
Management of Incidental Pulmonary Nodules Detected on CT Images:

## 2022-09-19 IMAGING — CT CT CHEST W/O CM
2 of 4 series · 15 of 36 positions shown, 18 images · non-contrast
Comparison: None.

CLINICAL DATA: Follow-up pulmonary nodules

EXAM:
CT CHEST WITHOUT CONTRAST
TECHNIQUE: Multidetector CT imaging of the chest was performed following the
standard protocol without IV contrast.

[Series 2: thorax · axial · 0.75mm/px · z∈[-91,+191]mm · 12 of 165 slices shown, 15 images]
[im 12/165  mediastinal]
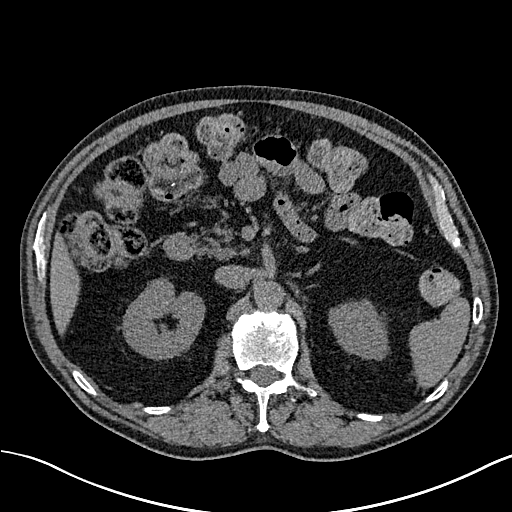
[im 12/165  lung]
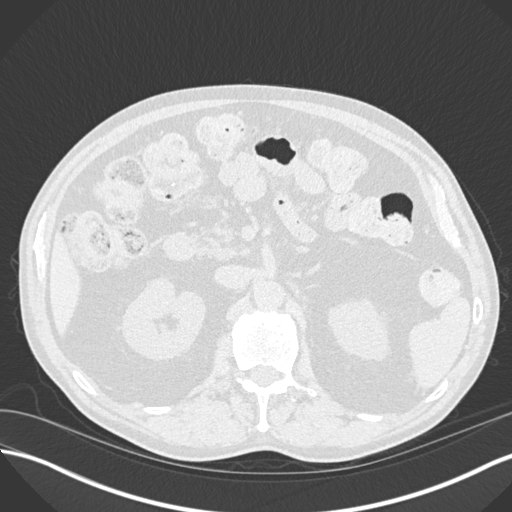
[im 24/165  lung]
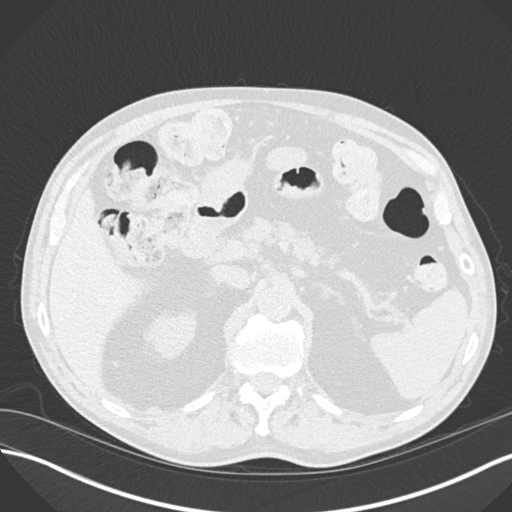
[im 36/165  lung]
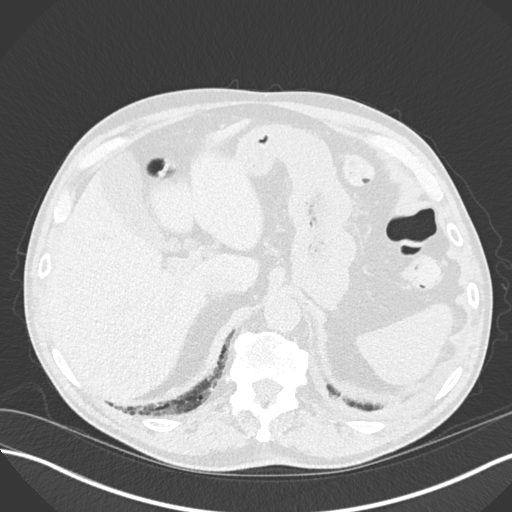
[im 47/165  lung]
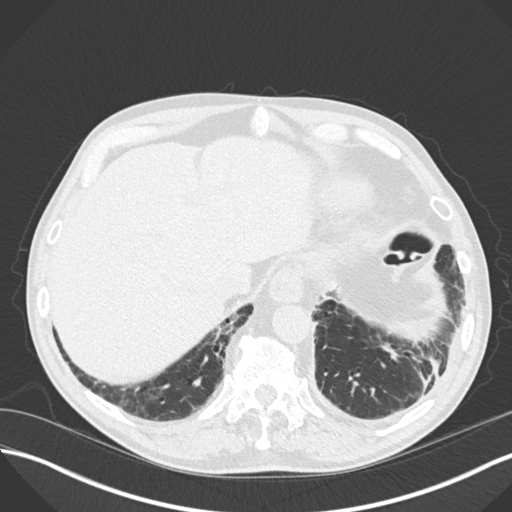
[im 59/165  mediastinal]
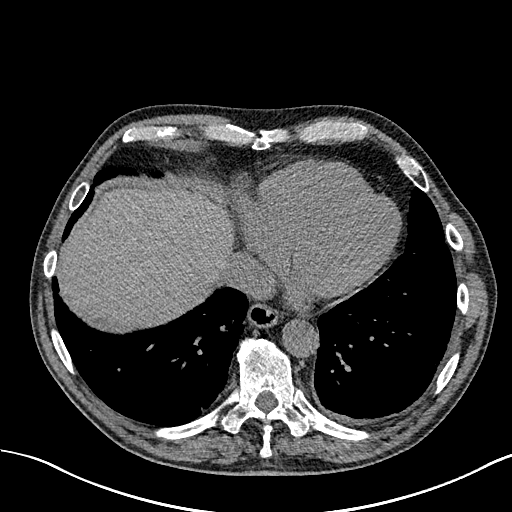
[im 59/165  lung]
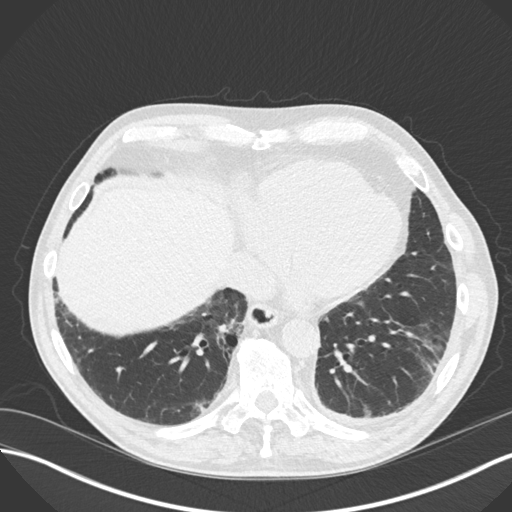
[im 71/165  lung]
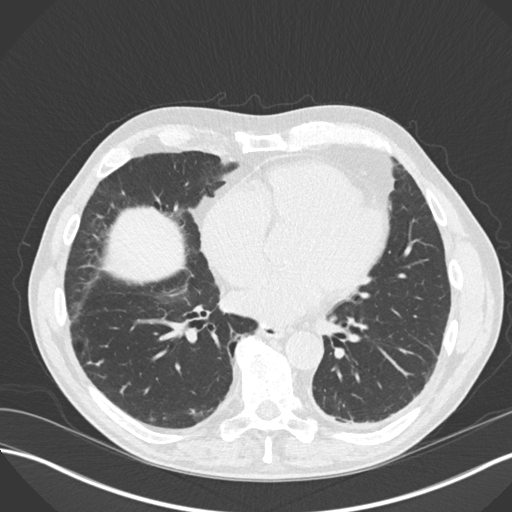
[im 94/165  lung]
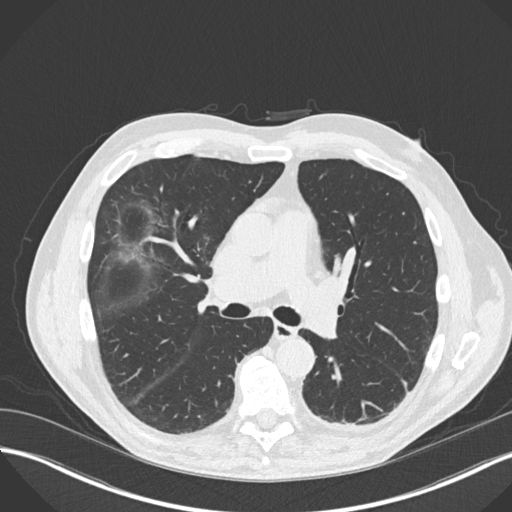
[im 106/165  lung]
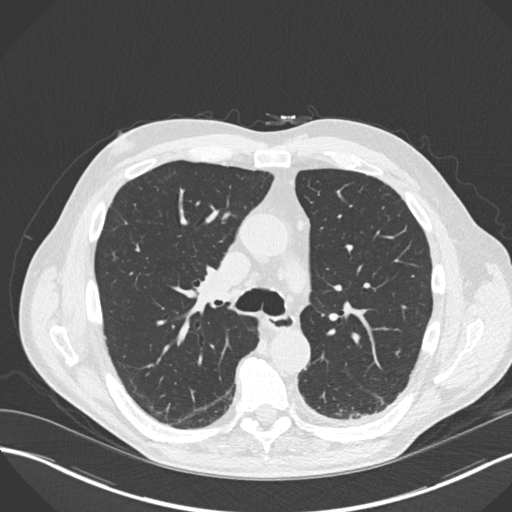
[im 118/165  mediastinal]
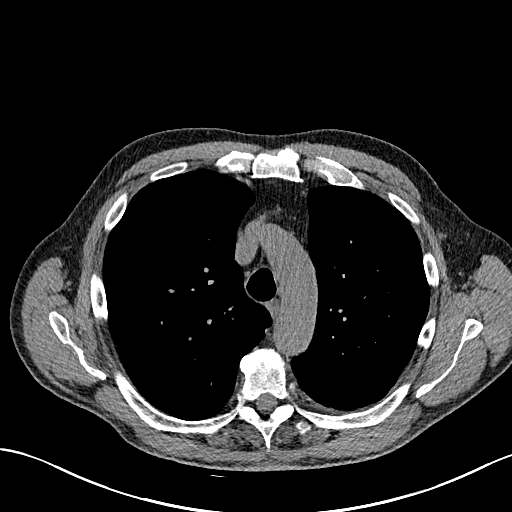
[im 118/165  lung]
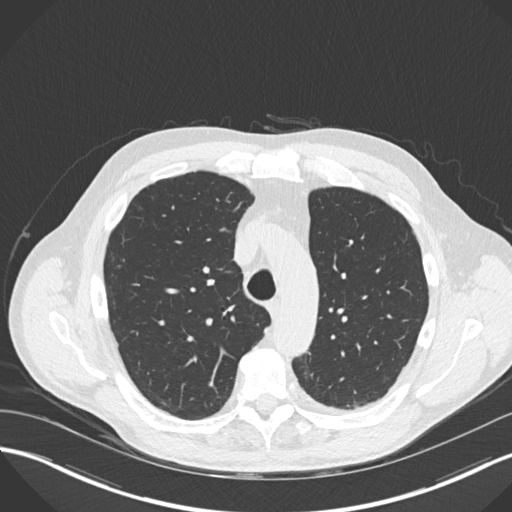
[im 129/165  lung]
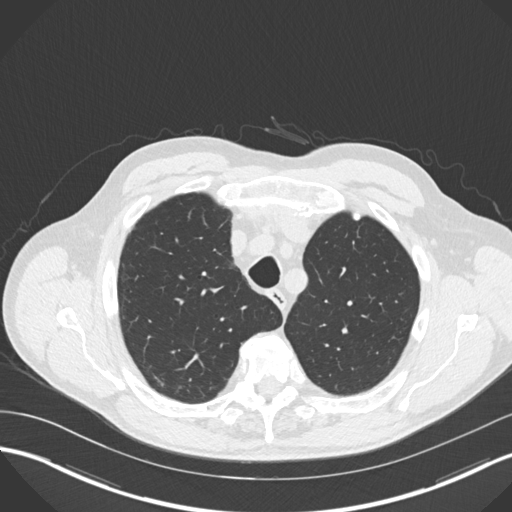
[im 141/165  lung]
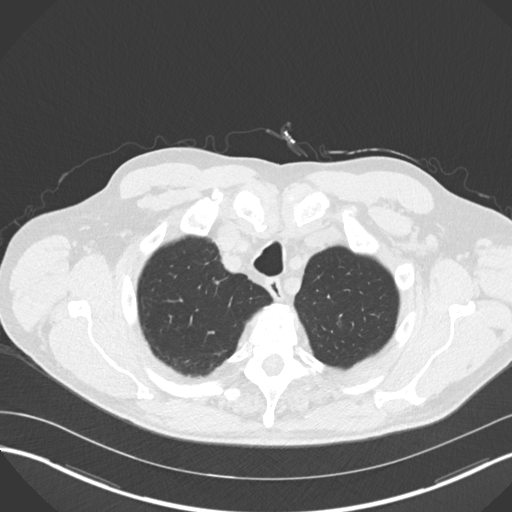
[im 153/165  lung]
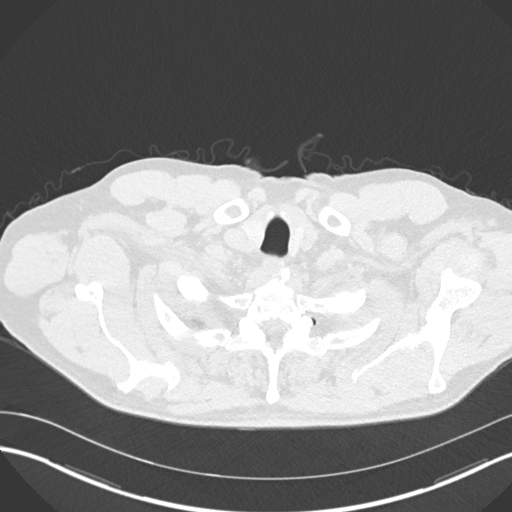

[Series 5: coronal · coronal · 0.67mm/px · 3 of 150 slices shown]
[im 30/150  lung]
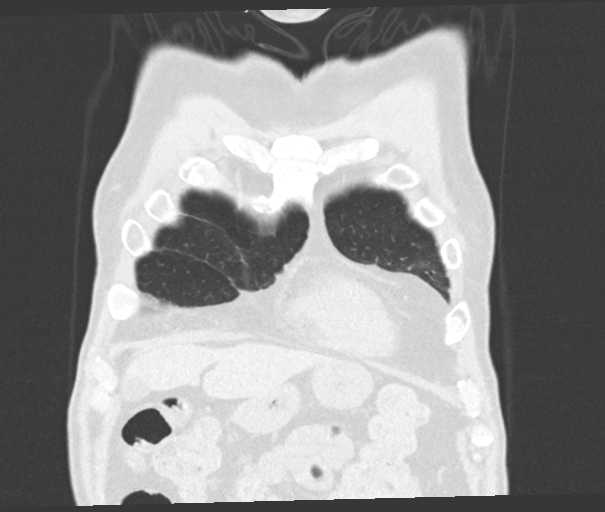
[im 60/150  lung]
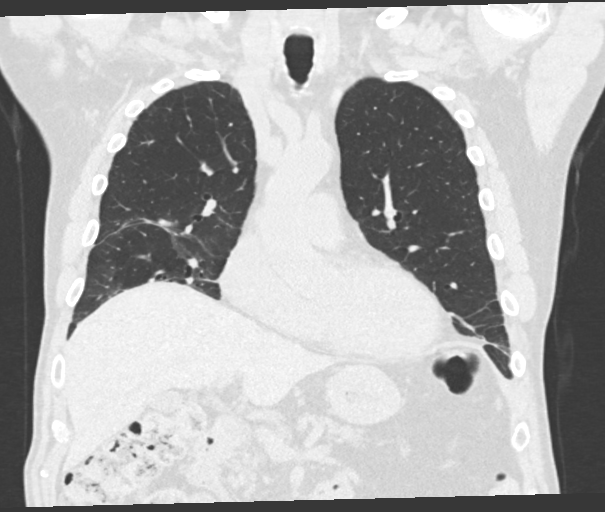
[im 90/150  lung]
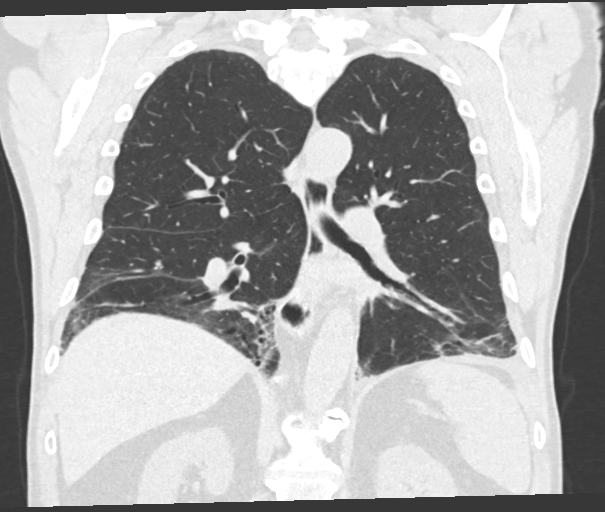

[15 of 36 positions shown; findings below may reference images not displayed]

FINDINGS: Cardiovascular: Scattered aortic atherosclerosis. Normal heart size.
No pericardial effusion.

Mediastinum/Nodes: No enlarged mediastinal, hilar, or axillary lymph
nodes. Thyroid gland, trachea, and esophagus demonstrate no
significant findings.

Lungs/Pleura: Lungs are clear. Predominantly dependent bibasilar
scarring and or fibrosis featuring irregular peripheral interstitial
opacity, mild, tubular bronchiectasis, and architectural distortion
and volume loss of the lower lobes. There is some associated
peripheral ground-glass opacity, again with some evidence of
subpleural sparing (series 4, image 61). A previously seen 4 mm
nodule of the lingula is resolved. Other occasional small pulmonary
nodules are stable and definitively benign.

Upper Abdomen: No acute abnormality.

Musculoskeletal: No chest wall mass or suspicious bone lesions
identified.
IMPRESSION: 1. Predominantly dependent bibasilar scarring and or fibrosis
featuring irregular peripheral interstitial opacity, mild, tubular
bronchiectasis, and architectural distortion and volume loss of the
lower lobes. There is some associated peripheral ground-glass
opacity, again with some evidence of subpleural sparing. Findings
are unchanged compared to prior and most likely reflect bland
sequelae of prior infection or aspiration, although there is
possibly a mild, superimposed fibrotic interstitial lung disease.
2. A previously seen 4 mm nodule of the lingula is resolved,
consistent with a resolution of nonspecific infection or
inflammation. Other occasional small pulmonary nodules are stable
and definitively benign. No further follow-up or characterization is
required.

Aortic Atherosclerosis ([id number]).

## 2023-01-11 ENCOUNTER — Encounter: Payer: Self-pay | Admitting: *Deleted

## 2023-01-18 ENCOUNTER — Ambulatory Visit: Payer: Medicare Other | Admitting: Registered Nurse

## 2023-01-18 ENCOUNTER — Ambulatory Visit
Admission: RE | Admit: 2023-01-18 | Discharge: 2023-01-18 | Disposition: A | Discharge status: Home / Self Care | Payer: Medicare Other | Attending: Gastroenterology | Admitting: Gastroenterology

## 2023-01-18 ENCOUNTER — Encounter
Admission: RE | Disposition: A | Discharge status: Home / Self Care | Payer: Self-pay | Source: Home / Self Care | Attending: Gastroenterology

## 2023-01-18 DIAGNOSIS — Z86010 Personal history of colonic polyps: Secondary | ICD-10-CM | POA: Diagnosis not present

## 2023-01-18 DIAGNOSIS — E785 Hyperlipidemia, unspecified: Secondary | ICD-10-CM | POA: Diagnosis not present

## 2023-01-18 DIAGNOSIS — K219 Gastro-esophageal reflux disease without esophagitis: Secondary | ICD-10-CM | POA: Insufficient documentation

## 2023-01-18 DIAGNOSIS — J8410 Pulmonary fibrosis, unspecified: Secondary | ICD-10-CM | POA: Insufficient documentation

## 2023-01-18 DIAGNOSIS — I341 Nonrheumatic mitral (valve) prolapse: Secondary | ICD-10-CM | POA: Insufficient documentation

## 2023-01-18 DIAGNOSIS — K5730 Diverticulosis of large intestine without perforation or abscess without bleeding: Secondary | ICD-10-CM | POA: Diagnosis not present

## 2023-01-18 DIAGNOSIS — K648 Other hemorrhoids: Secondary | ICD-10-CM | POA: Diagnosis not present

## 2023-01-18 DIAGNOSIS — Z08 Encounter for follow-up examination after completed treatment for malignant neoplasm: Secondary | ICD-10-CM | POA: Diagnosis not present

## 2023-01-18 DIAGNOSIS — Z1211 Encounter for screening for malignant neoplasm of colon: Secondary | ICD-10-CM | POA: Insufficient documentation

## 2023-01-18 DIAGNOSIS — Z8546 Personal history of malignant neoplasm of prostate: Secondary | ICD-10-CM | POA: Insufficient documentation

## 2023-01-18 DIAGNOSIS — J449 Chronic obstructive pulmonary disease, unspecified: Secondary | ICD-10-CM | POA: Insufficient documentation

## 2023-01-18 DIAGNOSIS — Z79899 Other long term (current) drug therapy: Secondary | ICD-10-CM | POA: Insufficient documentation

## 2023-01-18 DIAGNOSIS — Z09 Encounter for follow-up examination after completed treatment for conditions other than malignant neoplasm: Secondary | ICD-10-CM | POA: Insufficient documentation

## 2023-01-18 DIAGNOSIS — Z87891 Personal history of nicotine dependence: Secondary | ICD-10-CM | POA: Insufficient documentation

## 2023-01-18 DIAGNOSIS — K640 First degree hemorrhoids: Secondary | ICD-10-CM | POA: Insufficient documentation

## 2023-01-18 HISTORY — DX: Other cervical disc degeneration, unspecified cervical region: M50.30

## 2023-01-18 HISTORY — DX: Hyperlipidemia, unspecified: E78.5

## 2023-01-18 HISTORY — DX: Nonrheumatic mitral (valve) prolapse: I34.1

## 2023-01-18 HISTORY — DX: Chronic obstructive pulmonary disease, unspecified: J44.9

## 2023-01-18 HISTORY — PX: COLONOSCOPY WITH PROPOFOL: SHX5780

## 2023-01-18 HISTORY — DX: Unspecified osteoarthritis, unspecified site: M19.90

## 2023-01-18 SURGERY — COLONOSCOPY WITH PROPOFOL
Anesthesia: General

## 2023-01-18 MED ORDER — PROPOFOL 10 MG/ML IV BOLUS
INTRAVENOUS | Status: DC | PRN
  Administered 2023-01-18: 80 mg via INTRAVENOUS
  Administered 2023-01-18: 40 mg via INTRAVENOUS
  Administered 2023-01-18: 50 mg via INTRAVENOUS
  Administered 2023-01-18: 30 mg via INTRAVENOUS
  Administered 2023-01-18: 50 mg via INTRAVENOUS

## 2023-01-18 MED ORDER — SODIUM CHLORIDE 0.9 % IV SOLN
INTRAVENOUS | Status: DC
  Administered 2023-01-18: 1000 mL via INTRAVENOUS

## 2023-01-18 MED ORDER — LIDOCAINE HCL (CARDIAC) PF 100 MG/5ML IV SOSY
PREFILLED_SYRINGE | INTRAVENOUS | Status: DC | PRN
  Administered 2023-01-18: 120 mg via INTRAVENOUS

## 2023-01-18 NOTE — Anesthesia Preprocedure Evaluation (Addendum)
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   Pulmonary exam normal breath sounds clear to auscultation       Cardiovascular Normal cardiovascular exam+ Valvular Problems/Murmurs MVP  Rhythm:Regular Rate:Normal  Echo 08/29/22:    NORMAL LEFT VENTRICULAR SYSTOLIC FUNCTION    NORMAL LA PRESSURES WITH NORMAL DIASTOLIC FUNCTION    MILD RV SYSTOLIC DYSFUNCTION    VALVULAR REGURGITATION: MILD TR    NO VALVULAR STENOSIS    NEGATIVE SALINE MICROCAVITATION STUDY    NO PRIOR STUDY FOR COMPARISON    Neuro/Psych negative neurological ROS     GI/Hepatic hiatal hernia,GERD  ,,  Endo/Other  negative endocrine ROS    Renal/GU Renal disease (nephrolithiasis)   Prostate CA    Musculoskeletal  (+) Arthritis ,    Abdominal   Peds  Hematology negative hematology ROS (+)   Anesthesia Other Findings Cardiology note 08/29/22:  1. Near syncopal event - vasovagal/neurocardiogenic syncope Recommendations: Patient does have some risk factors for MI (age, male, tobacco hx, HLD) however symptomatology more consistent with neurocardiogenic/vasovagal syncope (prodrome of symptoms in setting of prolonged standing and nausea). Enzymes ruled out in ER. Does not have anginal symptoms. Limited utility in stress testing. Can always revisit in future if develops symptoms Reassurance provided and educated on lifestyle measures to mitigate vasovagal syncope as well as management of future episode  2. HLD Recommendations: Takes pravastatin 40mg   3. 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 by most recent echo - has been 6 years since prior echo; will get routine surveillance resting TTE - warning symptoms discussed for future issues with mv  RTC PRN    Reproductive/Obstetrics                             Anesthesia Physical Anesthesia Plan  ASA: 2  Anesthesia Plan: General   Post-op Pain Management:    Induction: Intravenous  PONV Risk Score and Plan: 2 and Propofol infusion, TIVA and Treatment may vary due to age or medical condition  Airway Management Planned: Natural Airway  Additional Equipment:   Intra-op Plan:   Post-operative Plan:   Informed Consent: I have reviewed the patients History and Physical, chart, labs and discussed the procedure including the risks, benefits and alternatives for the proposed anesthesia with the patient or authorized representative who has indicated his/her understanding and acceptance.       Plan Discussed with: CRNA  Anesthesia Plan Comments: (LMA/GETA backup discussed.  Patient consented for risks of anesthesia including but not limited to:  - adverse reactions to medications - damage to eyes, teeth, lips or other oral mucosa - nerve damage due to positioning  - sore throat or hoarseness - damage to heart, brain, nerves, lungs, other parts of body or loss of life  Informed patient about role of CRNA in peri- and intra-operative care.  Patient voiced understanding.)        Anesthesia Quick Evaluation

## 2023-01-18 NOTE — Anesthesia Postprocedure Evaluation (Signed)
Anesthesia Post Note  Patient: Tristan Anderson  Procedure(s) Performed: COLONOSCOPY WITH PROPOFOL  Patient location during evaluation: PACU Anesthesia Type: General Level of consciousness: awake and alert, oriented and patient cooperative Pain management: pain level controlled Vital Signs Assessment: post-procedure vital signs reviewed and stable Respiratory status: spontaneous breathing, nonlabored ventilation and respiratory function stable Cardiovascular status: blood pressure returned to baseline and stable Postop Assessment: adequate PO intake Anesthetic complications: no   No notable events documented.   Last Vitals:  Vitals:   01/18/23 1133 01/18/23 1143  BP: 120/73 (!) 128/108  Pulse: 82 77  Resp: 19 18  Temp:    SpO2: 97% 99%    Last Pain:  Vitals:   01/18/23 1143  TempSrc:   PainSc: 0-No pain                 Reed Breech

## 2023-01-18 NOTE — Transfer of Care (Signed)
Immediate Anesthesia Transfer of Care Note  Patient: Tristan Anderson  Procedure(s) Performed: COLONOSCOPY WITH PROPOFOL  Patient Location: Endoscopy Unit  Anesthesia Type:General  Level of Consciousness: awake and drowsy  Airway & Oxygen Therapy: Patient Spontanous Breathing  Post-op Assessment: Report given to RN and Post -op Vital signs reviewed and stable  Post vital signs: Reviewed and stable  Last Vitals:  Vitals Value Taken Time  BP 101/62 01/18/23 1123  Temp 36.1 C 01/18/23 1123  Pulse 74 01/18/23 1124  Resp 20 01/18/23 1124  SpO2 96 % 01/18/23 1124    Last Pain:  Vitals:   01/18/23 1123  TempSrc: Temporal  PainSc: Asleep         Complications: No notable events documented.

## 2023-01-18 NOTE — H&P (Signed)
Outpatient short stay form Pre-procedure 01/18/2023  Tristan Bill, MD  Primary Physician: Dione Housekeeper, MD  Reason for visit:  Surveillance  History of present illness:  77 y/o gentleman with history of COPD, GERD, and HLD here for surveillance colonoscopy. Last colonoscopy in 2019 with small adenomatous polyps. No blood thinners. No family history of GI malignancies. No significant abdominal surgeries.    Current Facility-Administered Medications:    0.9 %  sodium chloride infusion, , Intravenous, Continuous, Danon Lograsso, Rossie Muskrat, MD, Last Rate: 20 mL/hr at 01/18/23 1038, 1,000 mL at 01/18/23 1038  Medications Prior to Admission  Medication Sig Dispense Refill Last Dose   aspirin EC 81 MG tablet Take 81 mg by mouth daily.   Past Week   clotrimazole-betamethasone (LOTRISONE) cream Apply 1 application topically 2 (two) times daily.   Past Week   fexofenadine (ALLEGRA) 30 MG tablet Take 30 mg by mouth 2 (two) times daily as needed.   01/17/2023   fluticasone (FLONASE) 50 MCG/ACT nasal spray Place into both nostrils 2 (two) times daily as needed for allergies or rhinitis.   01/17/2023   Multiple Vitamin (MULTIVITAMIN) tablet Take 1 tablet by mouth daily.   01/17/2023   pantoprazole (PROTONIX) 40 MG tablet Take 40 mg by mouth daily.   01/17/2023   pravastatin (PRAVACHOL) 40 MG tablet Take 40 mg by mouth daily.   01/17/2023   albuterol (PROVENTIL HFA;VENTOLIN HFA) 108 (90 Base) MCG/ACT inhaler Inhale into the lungs every 6 (six) hours as needed for wheezing or shortness of breath.    at prn   benzonatate (TESSALON) 100 MG capsule Take 2 capsules (200 mg total) by mouth every 8 (eight) hours. (Patient not taking: Reported on 01/11/2023) 21 capsule 0 Not Taking   gabapentin (NEURONTIN) 100 MG capsule Take by mouth. (Patient not taking: Reported on 01/11/2023)   Not Taking   ipratropium (ATROVENT) 0.06 % nasal spray Place 2 sprays into both nostrils 4 (four) times daily. 15 mL 12  at prn    predniSONE (DELTASONE) 10 MG tablet Take by mouth. (Patient not taking: Reported on 01/18/2023)   Completed Course   sildenafil (REVATIO) 20 MG tablet Take 20 mg by mouth 3 (three) times daily.      TRELEGY ELLIPTA 100-62.5-25 MCG/INH AEPB Inhale 1 puff into the lungs daily. (Patient not taking: Reported on 01/18/2023)   Not Taking     Allergies  Allergen Reactions   Darvon [Propoxyphene]      Past Medical History:  Diagnosis Date   Allergic rhinitis    Arthritis    Cancer (HCC)    prostate   COPD (chronic obstructive pulmonary disease) (HCC)    Cyst of right kidney    DDD (degenerative disc disease), cervical    ED (erectile dysfunction)    GERD (gastroesophageal reflux disease)    History of hiatal hernia    History of kidney stones    Hyperlipidemia    Mitral valve prolapse    Tinnitus of both ears     Review of systems:  Otherwise negative.    Physical Exam  Gen: Alert, oriented. Appears stated age.  HEENT: PERRLA. Lungs: No respiratory distress CV: RRR Abd: soft, benign, no masses Ext: No edema    Planned procedures: Proceed with colonoscopy. The patient understands the nature of the planned procedure, indications, risks, alternatives and potential complications including but not limited to bleeding, infection, perforation, damage to internal organs and possible oversedation/side effects from anesthesia. The patient agrees and gives  consent to proceed.  Please refer to procedure notes for findings, recommendations and patient disposition/instructions.     Tristan Bill, MD [facility]

## 2023-01-18 NOTE — Interval H&P Note (Signed)
History and Physical Interval Note:  01/18/2023 10:56 AM  Tristan Anderson  has presented today for surgery, with the diagnosis of hx of adenomatous polyp of colon.  The various methods of treatment have been discussed with the patient and family. After consideration of risks, benefits and other options for treatment, the patient has consented to  Procedure(s): COLONOSCOPY WITH PROPOFOL (N/A) as a surgical intervention.  The patient's history has been reviewed, patient examined, no change in status, stable for surgery.  I have reviewed the patient's chart and labs.  Questions were answered to the patient's satisfaction.     Regis Bill  Ok to proceed with colonoscopy

## 2023-01-18 NOTE — Op Note (Signed)
[facility] Patient Name: Tristan Anderson Procedure Date: 01/18/2023 10:54 AM MRN: [id number] Account #: [id number] Date of Birth: Jun 19, 1946 Admit Type: Outpatient Age: 77 Room: [facility] ENDO ROOM 3 Gender: Male Note Status: Finalized Instrument Name: Prentice Docker [id number] Procedure:             Colonoscopy Indications:           Surveillance: Personal history of adenomatous polyps                         on last colonoscopy 5 years ago Providers:             Eather Colas MD, MD Referring MD:          Nat Christen. Zada Finders, MD (Referring MD) Medicines:             Monitored Anesthesia Care Complications:         No immediate complications. Procedure:             Pre-Anesthesia Assessment:                        - Prior to the procedure, a History and Physical was                         performed, and patient medications and allergies were                         reviewed. The patient is competent. The risks and                         benefits of the procedure and the sedation options and                         risks were discussed with the patient. All questions                         were answered and informed consent was obtained.                         Patient identification and proposed procedure were                         verified by the physician, the nurse, the                         anesthesiologist, the anesthetist and the technician                         in the endoscopy suite. Mental Status Examination:                         alert and oriented. Airway Examination: normal                         oropharyngeal airway and neck mobility. Respiratory                         Examination: clear to auscultation. CV Examination:  normal. Prophylactic Antibiotics: The patient does not                         require prophylactic antibiotics. Prior                         Anticoagulants: The patient has taken no  anticoagulant                         or antiplatelet agents. ASA Grade Assessment: II - A                         patient with mild systemic disease. After reviewing                         the risks and benefits, the patient was deemed in                         satisfactory condition to undergo the procedure. The                         anesthesia plan was to use monitored anesthesia care                         (MAC). Immediately prior to administration of                         medications, the patient was re-assessed for adequacy                         to receive sedatives. The heart rate, respiratory                         rate, oxygen saturations, blood pressure, adequacy of                         pulmonary ventilation, and response to care were                         monitored throughout the procedure. The physical                         status of the patient was re-assessed after the                         procedure.                        After obtaining informed consent, the colonoscope was                         passed under direct vision. Throughout the procedure,                         the patient's blood pressure, pulse, and oxygen                         saturations were monitored continuously. The  Colonoscope was introduced through the anus and                         advanced to the the cecum, identified by appendiceal                         orifice and ileocecal valve. The colonoscopy was                         performed without difficulty. The patient tolerated                         the procedure well. The quality of the bowel                         preparation was good. The ileocecal valve, appendiceal                         orifice, and rectum were photographed. Findings:      The perianal and digital rectal examinations were normal.      Multiple small-mouthed diverticula were found in the sigmoid colon.      Internal  hemorrhoids were found during retroflexion. The hemorrhoids       were Grade I (internal hemorrhoids that do not prolapse).      The exam was otherwise without abnormality on direct and retroflexion       views. Impression:            - Diverticulosis in the sigmoid colon.                        - Internal hemorrhoids.                        - The examination was otherwise normal on direct and                         retroflexion views.                        - No specimens collected. Recommendation:        - Discharge patient to home.                        - Resume previous diet.                        - Continue present medications.                        - Repeat colonoscopy is not recommended due to current                         age (57 years or older) for surveillance.                        - Return to referring physician as previously                         scheduled. Procedure Code(s):     --- Professional ---  G0105, Colorectal cancer screening; colonoscopy on                         individual at high risk Diagnosis Code(s):     --- Professional ---                        Z86.010, Personal history of colonic polyps                        K64.0, First degree hemorrhoids                        K57.30, Diverticulosis of large intestine without                         perforation or abscess without bleeding CPT copyright 2022 [employer]. All rights reserved. The codes documented in this report are preliminary and upon coder review may  be revised to meet current compliance requirements. Eather Colas MD, MD 01/18/2023 11:25:09 AM Number of Addenda: 0 Note Initiated On: 01/18/2023 10:54 AM Scope Withdrawal Time: 0 hours 8 minutes 21 seconds  Total Procedure Duration: 0 hours 14 minutes 56 seconds  Estimated Blood Loss:  Estimated blood loss: none.      [facility]

## 2023-01-21 ENCOUNTER — Encounter: Payer: Self-pay | Admitting: Gastroenterology

## 2023-04-17 ENCOUNTER — Other Ambulatory Visit: Payer: Self-pay | Admitting: Specialist

## 2023-04-17 DIAGNOSIS — J986 Disorders of diaphragm: Secondary | ICD-10-CM

## 2023-04-17 DIAGNOSIS — R0602 Shortness of breath: Secondary | ICD-10-CM

## 2023-04-24 ENCOUNTER — Ambulatory Visit: Payer: Medicare Other

## 2023-04-25 ENCOUNTER — Ambulatory Visit
Admission: RE | Admit: 2023-04-25 | Discharge: 2023-04-25 | Disposition: A | Discharge status: Home / Self Care | Payer: Medicare Other | Source: Ambulatory Visit | Attending: Specialist | Admitting: Specialist

## 2023-04-25 DIAGNOSIS — J986 Disorders of diaphragm: Secondary | ICD-10-CM | POA: Insufficient documentation

## 2023-04-25 DIAGNOSIS — R0602 Shortness of breath: Secondary | ICD-10-CM | POA: Insufficient documentation

## 2023-05-10 ENCOUNTER — Other Ambulatory Visit: Payer: Self-pay | Admitting: Specialist

## 2023-05-10 DIAGNOSIS — J849 Interstitial pulmonary disease, unspecified: Secondary | ICD-10-CM

## 2023-05-17 ENCOUNTER — Ambulatory Visit
Admission: RE | Admit: 2023-05-17 | Discharge: 2023-05-17 | Disposition: A | Discharge status: Home / Self Care | Payer: Medicare Other | Source: Ambulatory Visit | Attending: Specialist | Admitting: Specialist

## 2023-05-17 DIAGNOSIS — J849 Interstitial pulmonary disease, unspecified: Secondary | ICD-10-CM | POA: Insufficient documentation
